# Patient Record
Sex: Male | Born: 2000 | Race: White | Hispanic: Yes | Marital: Single | Smoking: Never smoker
Health system: Southern US, Community
[De-identification: ages and names within clinical notes are randomized; demographics above are authoritative.]

---
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
## 2022-03-18 ENCOUNTER — Emergency Department (HOSPITAL_COMMUNITY): Payer: 59

## 2022-03-18 ENCOUNTER — Other Ambulatory Visit: Payer: Self-pay

## 2022-03-18 ENCOUNTER — Emergency Department (HOSPITAL_COMMUNITY)
Admission: EM | Admit: 2022-03-18 | Discharge: 2022-03-18 | Disposition: A | Discharge status: Home / Self Care | Payer: 59 | Attending: Emergency Medicine | Admitting: Emergency Medicine

## 2022-03-18 DIAGNOSIS — R112 Nausea with vomiting, unspecified: Secondary | ICD-10-CM | POA: Diagnosis not present

## 2022-03-18 DIAGNOSIS — R1084 Generalized abdominal pain: Secondary | ICD-10-CM | POA: Diagnosis not present

## 2022-03-18 DIAGNOSIS — D72829 Elevated white blood cell count, unspecified: Secondary | ICD-10-CM | POA: Insufficient documentation

## 2022-03-18 DIAGNOSIS — R197 Diarrhea, unspecified: Secondary | ICD-10-CM | POA: Diagnosis not present

## 2022-03-18 DIAGNOSIS — R109 Unspecified abdominal pain: Secondary | ICD-10-CM | POA: Diagnosis present

## 2022-03-18 DIAGNOSIS — Z1152 Encounter for screening for COVID-19: Secondary | ICD-10-CM | POA: Diagnosis not present

## 2022-03-18 LAB — COMPREHENSIVE METABOLIC PANEL
ALT: 112 U/L — ABNORMAL HIGH (ref 0–44)
AST: 56 U/L — ABNORMAL HIGH (ref 15–41)
Albumin: 4 g/dL (ref 3.5–5.0)
Alkaline Phosphatase: 67 U/L (ref 38–126)
Anion gap: 12 (ref 5–15)
BUN: 11 mg/dL (ref 6–20)
CO2: 22 mmol/L (ref 22–32)
Calcium: 9 mg/dL (ref 8.9–10.3)
Chloride: 103 mmol/L (ref 98–111)
Creatinine, Ser: 0.81 mg/dL (ref 0.61–1.24)
GFR, Estimated: >60 mL/min (ref >60)
Glucose, Bld: 105 mg/dL — ABNORMAL HIGH (ref 70–99)
Potassium: 3.9 mmol/L (ref 3.5–5.1)
Sodium: 137 mmol/L (ref 135–145)
Total Bilirubin: 0.9 mg/dL (ref 0.3–1.2)
Total Protein: 7.8 g/dL (ref 6.5–8.1)

## 2022-03-18 LAB — URINALYSIS, ROUTINE W REFLEX MICROSCOPIC
Bilirubin Urine: NEGATIVE
Glucose, UA: NEGATIVE mg/dL
Hgb urine dipstick: NEGATIVE
Ketones, ur: NEGATIVE mg/dL
Nitrite: NEGATIVE
Protein, ur: NEGATIVE mg/dL
Specific Gravity, Urine: 1.026 (ref 1.005–1.030)
pH: 5 (ref 5.0–8.0)

## 2022-03-18 LAB — CBC
HCT: 51.6 % (ref 39.0–52.0)
Hemoglobin: 17.2 g/dL — ABNORMAL HIGH (ref 13.0–17.0)
MCH: 29.5 pg (ref 26.0–34.0)
MCHC: 33.3 g/dL (ref 30.0–36.0)
MCV: 88.4 fL (ref 80.0–100.0)
Platelets: 323 10*3/uL (ref 150–400)
RBC: 5.84 MIL/uL — ABNORMAL HIGH (ref 4.22–5.81)
RDW: 12.9 % (ref 11.5–15.5)
WBC: 16 10*3/uL — ABNORMAL HIGH (ref 4.0–10.5)
nRBC: 0 % (ref 0.0–0.2)

## 2022-03-18 LAB — RESP PANEL BY RT-PCR (RSV, FLU A&B, COVID)  RVPGX2
Influenza A by PCR: NEGATIVE
Influenza B by PCR: NEGATIVE
Resp Syncytial Virus by PCR: NEGATIVE
SARS Coronavirus 2 by RT PCR: NEGATIVE

## 2022-03-18 LAB — LIPASE, BLOOD: Lipase: 34 U/L (ref 11–51)

## 2022-03-18 LAB — MAGNESIUM: Magnesium: 1.8 mg/dL (ref 1.7–2.4)

## 2022-03-18 MED ORDER — MORPHINE SULFATE (PF) 4 MG/ML IV SOLN: 4.0000 mg | Freq: Once | INTRAVENOUS | Status: DC

## 2022-03-18 MED ORDER — DICYCLOMINE HCL 10 MG PO CAPS
20.0000 mg | ORAL_CAPSULE | Freq: Once | ORAL | Status: AC
  Administered 2022-03-18: 20 mg via ORAL
  Filled 2022-03-18: qty 2

## 2022-03-18 MED ORDER — ONDANSETRON 4 MG PO TBDP: 4.0000 mg | ORAL_TABLET | Freq: Three times a day (TID) | ORAL | 0 refills | Status: DC | PRN

## 2022-03-18 MED ORDER — PANTOPRAZOLE SODIUM 40 MG IV SOLR
40.0000 mg | Freq: Once | INTRAVENOUS | Status: AC
  Administered 2022-03-18: 40 mg via INTRAVENOUS
  Filled 2022-03-18: qty 10

## 2022-03-18 MED ORDER — ONDANSETRON HCL 4 MG/2ML IJ SOLN
4.0000 mg | Freq: Once | INTRAMUSCULAR | Status: AC
  Administered 2022-03-18: 4 mg via INTRAVENOUS
  Filled 2022-03-18: qty 2

## 2022-03-18 MED ORDER — ONDANSETRON 4 MG PO TBDP
4.0000 mg | ORAL_TABLET | Freq: Once | ORAL | Status: AC | PRN
  Administered 2022-03-18: 4 mg via ORAL
  Filled 2022-03-18: qty 1

## 2022-03-18 MED ORDER — LACTATED RINGERS IV BOLUS
1000.0000 mL | Freq: Once | INTRAVENOUS | Status: AC
  Administered 2022-03-18: 1000 mL via INTRAVENOUS

## 2022-03-18 MED ORDER — ACETAMINOPHEN 500 MG PO TABS: 1000.0000 mg | ORAL_TABLET | Freq: Four times a day (QID) | ORAL | 0 refills | Status: DC | PRN

## 2022-03-18 MED ORDER — FAMOTIDINE 20 MG PO TABS: 20.0000 mg | ORAL_TABLET | Freq: Two times a day (BID) | ORAL | 0 refills | Status: DC

## 2022-03-18 MED ORDER — DICYCLOMINE HCL 20 MG PO TABS: 20.0000 mg | ORAL_TABLET | Freq: Two times a day (BID) | ORAL | 0 refills | Status: DC

## 2022-03-18 MED ORDER — ACETAMINOPHEN 500 MG PO TABS
1000.0000 mg | ORAL_TABLET | ORAL | Status: AC
  Administered 2022-03-18: 1000 mg via ORAL
  Filled 2022-03-18: qty 2

## 2022-03-18 NOTE — ED Triage Notes (Signed)
Patient here with complaint of abdominal pain, diarrhea, and emesis starting this morning. Patient states he thinks his symptoms may be related to something he ate at [employer] last night. Patient states emesis is now provoked by eating or drinking anything.

## 2022-03-18 NOTE — Discharge Instructions (Addendum)
History, take Tylenol 1000 mg every 6 hours, Zofran every 8 hours for 24 hours after that as needed for nausea.  Bentyl as prescribed as needed for pain.  Hydrate rest and follow-up with your primary care doctor return to the emergency room for any new or concerning symptoms.

## 2022-03-18 NOTE — ED Provider Notes (Signed)
[facility] Provider Note   CSN: [id number] Arrival date & time: 03/18/22  1702     History  Chief Complaint  Patient presents with   Abdominal Pain    William Rivas is a 21 y.o. male.   Abdominal Pain  Patient is a 21 year old male with no medical problems and no past surgical history  He is present emergency room today with complaints of nausea vomiting diarrhea that began yesterday evening.  He states this began after eating some food at a restaurant called [employer].  He states that his girlfriend who ate with him also had several episodes of vomiting but has since felt much better.  He states that he had 5 episodes of nonbilious emesis today.  He did state that he had a small thin streak of red and some of his emesis but states that he did also drink a red Gatorade.  He denies any history of alcohol use or liver disease.  He denies any melena or hematochezia.  No chest pain difficulty breathing.  He states his abdominal pain is crampy aching diffuse.  No urinary symptoms.      Home Medications Prior to Admission medications   Medication Sig Start Date End Date Taking? Authorizing Provider  acetaminophen (TYLENOL) 500 MG tablet Take 2 tablets (1,000 mg total) by mouth every 6 (six) hours as needed. 03/18/22  Yes Broly Hatfield S, PA  dicyclomine (BENTYL) 20 MG tablet Take 1 tablet (20 mg total) by mouth 2 (two) times daily. 03/18/22  Yes Dante Roudebush S, PA  famotidine (PEPCID) 20 MG tablet Take 1 tablet (20 mg total) by mouth 2 (two) times daily. 03/18/22  Yes Shaely Gadberry S, PA  ondansetron (ZOFRAN-ODT) 4 MG disintegrating tablet Take 1 tablet (4 mg total) by mouth every 8 (eight) hours as needed for nausea or vomiting. 03/18/22  Yes Kennett Symes S, PA  ibuprofen (ADVIL) 600 MG tablet Take 1 tablet (600 mg total) by mouth every 6 (six) hours as needed. 05/23/21   Dartha LodgeFord, Kelsey N, PA-C  methocarbamol (ROBAXIN) 500 MG  tablet Take 1 tablet (500 mg total) by mouth 2 (two) times daily. 05/23/21   Dartha LodgeFord, Kelsey N, PA-C      Allergies    Patient has no known allergies.    Review of Systems   Review of Systems  Gastrointestinal:  Positive for abdominal pain.    Physical Exam Updated Vital Signs BP 115/71   Pulse 90   Temp 99.8 F (37.7 C) (Oral)   Resp 20   SpO2 93%  Physical Exam Vitals and nursing note reviewed.  Constitutional:      General: He is not in acute distress. HENT:     Head: Normocephalic and atraumatic.     Nose: Nose normal.     Mouth/Throat:     Mouth: Mucous membranes are dry.  Eyes:     General: No scleral icterus. Cardiovascular:     Rate and Rhythm: Normal rate and regular rhythm.     Pulses: Normal pulses.     Heart sounds: Normal heart sounds.  Pulmonary:     Effort: Pulmonary effort is normal. No respiratory distress.     Breath sounds: No wheezing.  Abdominal:     Palpations: Abdomen is soft.     Tenderness: There is abdominal tenderness. There is no guarding or rebound.     Comments: Very mild nonfocal generalized abdominal tenderness.  Negative Rovsing and McBurney.  No guarding or  rebound  Musculoskeletal:     Cervical back: Normal range of motion.     Right lower leg: No edema.     Left lower leg: No edema.  Skin:    General: Skin is warm and dry.     Capillary Refill: Capillary refill takes less than 2 seconds.  Neurological:     Mental Status: He is alert. Mental status is at baseline.  Psychiatric:        Mood and Affect: Mood normal.        Behavior: Behavior normal.     ED Results / Procedures / Treatments   Labs (all labs ordered are listed, but only abnormal results are displayed) Labs Reviewed  COMPREHENSIVE METABOLIC PANEL - Abnormal; Notable for the following components:      Result Value   Glucose, Bld 105 (*)    AST 56 (*)    ALT 112 (*)    All other components within normal limits  CBC - Abnormal; Notable for the following  components:   WBC 16.0 (*)    RBC 5.84 (*)    Hemoglobin 17.2 (*)    All other components within normal limits  URINALYSIS, ROUTINE W REFLEX MICROSCOPIC - Abnormal; Notable for the following components:   Leukocytes,Ua TRACE (*)    Bacteria, UA RARE (*)    All other components within normal limits  RESP PANEL BY RT-PCR (RSV, FLU A&B, COVID)  RVPGX2  GASTROINTESTINAL PANEL BY PCR, STOOL (REPLACES STOOL CULTURE)  LIPASE, BLOOD  MAGNESIUM    EKG None  Radiology DG Chest 2 View  Result Date: 03/18/2022 CLINICAL DATA:  Hematemesis. EXAM: CHEST - 2 VIEW COMPARISON:  Chest two views 01/02/2004 and 06/16/2003 FINDINGS: Cardiac silhouette and mediastinal contours are within limits. Mildly to moderately decreased lung volumes. The lungs are clear. No pleural effusion or pneumothorax. No acute skeletal abnormality. IMPRESSION: Mildly to moderately decreased lung volumes. No acute cardiopulmonary process. Electronically Signed   By: Neita Garnet M.D.   On: 03/18/2022 18:19    Procedures Procedures    Medications Ordered in ED Medications  ondansetron (ZOFRAN-ODT) disintegrating tablet 4 mg (4 mg Oral Given 03/18/22 1732)  acetaminophen (TYLENOL) tablet 1,000 mg (1,000 mg Oral Given 03/18/22 1843)  dicyclomine (BENTYL) capsule 20 mg (20 mg Oral Given 03/18/22 1843)  lactated ringers bolus 1,000 mL (0 mLs Intravenous Stopped 03/18/22 2037)  ondansetron (ZOFRAN) injection 4 mg (4 mg Intravenous Given 03/18/22 1845)  pantoprazole (PROTONIX) injection 40 mg (40 mg Intravenous Given 03/18/22 1844)  lactated ringers bolus 1,000 mL (0 mLs Intravenous Stopped 03/18/22 2038)    ED Course/ Medical Decision Making/ A&P Clinical Course as of 03/18/22 2130  Sat Mar 18, 2022  1827 NVD started last night -- 5 ep vom today. Some red emesis -- red gatorade last night.    [WF]    Clinical Course User Index [WF] Gailen Shelter, PA                           Medical Decision Making Amount and/or  Complexity of Data Reviewed Labs: ordered.  Risk OTC drugs. Prescription drug management.   This patient presents to the ED for concern of nausea vomiting diarrhea abdominal pain fatigue, this involves a number of treatment options, and is a complaint that carries with it a moderate to high risk of complications and morbidity. A differential diagnosis was considered for the patient's symptoms which is discussed below:  The emergent differential diagnosis for vomiting includes, but is not limited to ACS/MI, Boerhaave's, DKA, Intracranial Hemorrhage, Ischemic bowel, Meningitis, Sepsis, Acute gastric dilation, Acetaminophen toxicity, Adrenal insufficiency, Appendicitis, Aspirin toxicity, Bowel obstruction/ileus, Cholecystitis, CNS tumor. Electrolyte abnormalities, Elevated ICP, Gastric outlet obstruction, Hyperemesis gravidarum, Pancreatitis, Peritonitis, Ruptured viscus, Testicular torsion/ovarian torsion, Biliary colic, Cannabinoid hyperemesis syndrome, Disulfiram effect, ETOH, Gastritis, Gastroenteritis, Gastroparesis, Hepatitis, Ibuprofen, Labyrinthitis, Migraine, Motion sickness, Narcotic withdrawal, Thyroid, Pregnancy, Peptic ulcer disease, Renal colic, and UTI  The causes of generalized abdominal pain include but are not limited to AAA, mesenteric ischemia, appendicitis, diverticulitis, DKA, gastritis, gastroenteritis, AMI, nephrolithiasis, pancreatitis, peritonitis, adrenal insufficiency,lead poisoning, iron toxicity, intestinal ischemia, constipation, UTI,SBO/LBO, splenic rupture, biliary disease, IBD, IBS, PUD, or hepatitis.   Co morbidities: Discussed in HPI   Brief History:  Patient is a 21 year old male with no medical problems and no past surgical history  He is present emergency room today with complaints of nausea vomiting diarrhea that began yesterday evening.  He states this began after eating some food at a restaurant called [employer].  He states that his girlfriend who  ate with him also had several episodes of vomiting but has since felt much better.  He states that he had 5 episodes of nonbilious emesis today.  He did state that he had a small thin streak of red and some of his emesis but states that he did also drink a red Gatorade.  He denies any history of alcohol use or liver disease.  He denies any melena or hematochezia.  No chest pain difficulty breathing.  He states his abdominal pain is crampy aching diffuse.  No urinary symptoms.     EMR reviewed including pt PMHx, past surgical history and past visits to ER.   See HPI for more details   Lab Tests:   I ordered and independently interpreted labs. Labs notable for Leukocytosis of 16 with elevated hemoglobin consistent with dehydration and hemoconcentration.  CMP with mild transaminitis likely caused by viral gastroenteritis.  Magnesium within normal limits COVID influenza RSV negative.  Urinalysis unremarkable lipase within normal limits. Sent patient home with GI pathogen panel collection materials.   Imaging Studies:  Offered CT on pelvis with contrast    Cardiac Monitoring:  The patient was maintained on a cardiac monitor.  I personally viewed and interpreted the cardiac monitored which showed an underlying rhythm of: NSR NA   Medicines ordered:  I ordered medication including lactated Ringer's 2 L, Zofran, Protonix, Tylenol, Bentyl for nausea vomiting diarrhea abdominal pain Reevaluation of the patient after these medicines showed that the patient resolved I have reviewed the patients home medicines and have made adjustments as needed   Critical Interventions:     Consults/Attending Physician   I discussed this case with my attending physician who cosigned this note including patient's presenting symptoms, physical exam, and planned diagnostics and interventions. Attending physician stated agreement with plan or made changes to plan which were  implemented.   Reevaluation:  After the interventions noted above I re-evaluated patient and found that they have :resolved   Social Determinants of Health:      Problem List / ED Course:  Nausea vomiting and diarrhea abdominal pain.  Patient's partner who ate with him last night had similar symptoms but they improved.  Patient has leukocytosis which is likely reactive in the setting of vomiting forcefully multiple times.  I doubt acute appendicitis.  Patient symptoms were somewhat abrupt in onset since yesterday associated nausea vomiting and diarrhea and  on reassessment after hydration and only Tylenol for analgesia patient has no abdominal tenderness no guarding or rebound.  He is able to stand at bedside jump and land on heels without pain.  She decision-making ovulation with patient he preferred to hold off on any cross-sectional imaging at this time, hydrate, Tylenol and antiemetics.  Follow-up with PCP.  Return precautions to the ER were provided.  Patient is tolerating p.o.  Ambulatory.  Will discharge home at this time. Dehydration likely secondary to above.   Dispostion:  After consideration of the diagnostic results and the patients response to treatment, I feel that the patent would benefit from close outpatient follow-up.  Return precautions   Final Clinical Impression(s) / ED Diagnoses Final diagnoses:  Generalized abdominal pain  Nausea vomiting and diarrhea    Rx / DC Orders ED Discharge Orders          Ordered    ondansetron (ZOFRAN-ODT) 4 MG disintegrating tablet  Every 8 hours PRN        03/18/22 2058    acetaminophen (TYLENOL) 500 MG tablet  Every 6 hours PRN        03/18/22 2058    dicyclomine (BENTYL) 20 MG tablet  2 times daily        03/18/22 2058    famotidine (PEPCID) 20 MG tablet  2 times daily        03/18/22 2058              Gailen Shelter, [place] 03/18/22 2133    Virgina Norfolk, DO 03/18/22 2145

## 2022-03-18 NOTE — ED Provider Triage Note (Signed)
Emergency Medicine Provider Triage Evaluation Note  William Rivas , a 21 y.o. male  was evaluated in triage.  Pt complains of nausea, vomiting, diarrhea with generalized abdominal pain.  Patient reports that this all started today.  Last night he is a bit expressed with the things is the culprit of his symptoms.  He reports that he is been vomiting several times over the last 3 times and vomiting some blood.  No melena or hematochezia.  No fevers.  No chest pain or sore throat..  Review of Systems  Positive:  Negative:   Physical Exam  BP 122/89 (BP Location: Right Arm)   Pulse (!) 135   Temp (!) 101 F (38.3 C)   Resp 16   SpO2 94%  Gen:   Awake, no distress   Resp:  Normal effort  MSK:   Moves extremities without difficulty  Other:  Generalized abdominal tenderness however very mild.  No guarding or rebound noted.  Medical Decision Making  Medically screening exam initiated at 5:36 PM.  Appropriate orders placed.  William Rivas was informed that the remainder of the evaluation will be completed by another provider, this initial triage assessment does not replace that evaluation, and the importance of remaining in the ED until their evaluation is complete.  Labs ordered   William Rivas, [place] 03/18/22 1737
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
## 2023-05-11 ENCOUNTER — Ambulatory Visit (HOSPITAL_COMMUNITY)
Admission: EM | Admit: 2023-05-11 | Discharge: 2023-05-11 | Disposition: A | Discharge status: Home / Self Care | Payer: BC Managed Care – PPO | Attending: Emergency Medicine | Admitting: Emergency Medicine

## 2023-05-11 ENCOUNTER — Encounter (HOSPITAL_COMMUNITY): Payer: Self-pay

## 2023-05-11 ENCOUNTER — Ambulatory Visit (INDEPENDENT_AMBULATORY_CARE_PROVIDER_SITE_OTHER): Payer: BC Managed Care – PPO

## 2023-05-11 DIAGNOSIS — R053 Chronic cough: Secondary | ICD-10-CM

## 2023-05-11 DIAGNOSIS — R058 Other specified cough: Secondary | ICD-10-CM

## 2023-05-11 MED ORDER — BENZONATATE 100 MG PO CAPS: 100.0000 mg | ORAL_CAPSULE | Freq: Three times a day (TID) | ORAL | 0 refills | Status: AC | PRN

## 2023-05-11 MED ORDER — ALBUTEROL SULFATE HFA 108 (90 BASE) MCG/ACT IN AERS
INHALATION_SPRAY | RESPIRATORY_TRACT | Status: AC
  Filled 2023-05-11: qty 6.7

## 2023-05-11 MED ORDER — ALBUTEROL SULFATE HFA 108 (90 BASE) MCG/ACT IN AERS
2.0000 | INHALATION_SPRAY | Freq: Once | RESPIRATORY_TRACT | Status: AC
  Administered 2023-05-11: 2 via RESPIRATORY_TRACT

## 2023-05-11 MED ORDER — PREDNISONE 20 MG PO TABS: 40.0000 mg | ORAL_TABLET | Freq: Every day | ORAL | 0 refills | Status: AC

## 2023-05-11 MED ORDER — ALBUTEROL SULFATE HFA 108 (90 BASE) MCG/ACT IN AERS: 2.0000 | INHALATION_SPRAY | Freq: Four times a day (QID) | RESPIRATORY_TRACT | 1 refills | Status: AC | PRN

## 2023-05-11 NOTE — Discharge Instructions (Addendum)
 I do not see anything abnormal on your chest x-ray.  I will call you if the radiologist sees something different.  For now I would like to treat you for post-viral cough:  Try albuterol inhaler -- 2 puffs inhaled 2-3 times daily for the next several days   Prednisone -- 40 mg daily for the next 5 days  Tessalon -- these are cough pills that can be taken 3 times daily. If this medication makes you drowsy, take only one pill before bed.  Please contact your primary care provider to make an appointment for follow up

## 2023-05-11 NOTE — ED Provider Notes (Signed)
 [facility]    CSN: [id number] Arrival date & time: 05/11/23  0906      History   Chief Complaint Chief Complaint  Patient presents with   Cough    HPI William Rivas is a 23 y.o. male.  Here with 2-3 week history of cough Cough is productive of green mucous He thinks he had the flu at beginning. Tactile fever and body aches. Has not had recently. No temp has been measured.  Denies chest pain, shortness of breath, wheezing No nasal congestion or sinus pressure.  Has been using nyquil   History reviewed. No pertinent past medical history.  There are no active problems to display for this patient.   History reviewed. No pertinent surgical history.    Home Medications    Prior to Admission medications   Medication Sig Start Date End Date Taking? Authorizing Provider  albuterol (VENTOLIN HFA) 108 (90 Base) MCG/ACT inhaler Inhale 2 puffs into the lungs every 6 (six) hours as needed for wheezing or shortness of breath. 05/11/23  Yes Teoman Giraud, Lurena Joiner, PA-C  benzonatate (TESSALON) 100 MG capsule Take 1 capsule (100 mg total) by mouth 3 (three) times daily as needed for cough. 05/11/23  Yes Oni Dietzman, Lurena Joiner, PA-C  predniSONE (DELTASONE) 20 MG tablet Take 2 tablets (40 mg total) by mouth daily with breakfast for 5 days. 05/11/23 05/16/23 Yes Tyisha Cressy, Ray Church    Family History History reviewed. No pertinent family history.  Social History Social History   Tobacco Use   Smoking status: Never  Substance Use Topics   Alcohol use: No   Drug use: No     Allergies   Patient has no known allergies.   Review of Systems Review of Systems  Respiratory:  Positive for cough.    Per HPI  Physical Exam Triage Vital Signs ED Triage Vitals  Encounter Vitals Group     BP 05/11/23 1044 (!) 132/94     Systolic BP Percentile --      Diastolic BP Percentile --      Pulse Rate 05/11/23 1044 92     Resp 05/11/23 1044 18     Temp 05/11/23 1044 97.9 F (36.6  C)     Temp Source 05/11/23 1044 Oral     SpO2 05/11/23 1044 95 %     Weight --      Height --      Head Circumference --      Peak Flow --      Pain Score 05/11/23 1045 0     Pain Loc --      Pain Education --      Exclude from Growth Chart --    No data found.  Updated Vital Signs BP (!) 132/94 (BP Location: Left Arm)   Pulse 92   Temp 97.9 F (36.6 C) (Oral)   Resp 18   SpO2 95%   Physical Exam Vitals and nursing note reviewed.  Constitutional:      General: He is not in acute distress.    Appearance: He is not ill-appearing.  HENT:     Right Ear: Tympanic membrane and ear canal normal.     Left Ear: Tympanic membrane and ear canal normal.     Nose: No congestion or rhinorrhea.     Mouth/Throat:     Mouth: Mucous membranes are moist.     Pharynx: Oropharynx is clear. No posterior oropharyngeal erythema.  Eyes:     Conjunctiva/sclera: Conjunctivae normal.  Cardiovascular:  Rate and Rhythm: Normal rate and regular rhythm.     Pulses: Normal pulses.     Heart sounds: Normal heart sounds.  Pulmonary:     Effort: Pulmonary effort is normal.     Breath sounds: Normal breath sounds. No wheezing, rhonchi or rales.  Musculoskeletal:     Cervical back: Normal range of motion.  Lymphadenopathy:     Cervical: No cervical adenopathy.  Skin:    General: Skin is warm and dry.  Neurological:     Mental Status: He is alert and oriented to person, place, and time.     UC Treatments / Results  Labs (all labs ordered are listed, but only abnormal results are displayed) Labs Reviewed - No data to display  EKG  Radiology No results found.  Procedures Procedures   Medications Ordered in UC Medications  albuterol (VENTOLIN HFA) 108 (90 Base) MCG/ACT inhaler 2 puff (has no administration in time range)    Initial Impression / Assessment and Plan / UC Course  I have reviewed the triage vital signs and the nursing notes.  Pertinent labs & imaging results that  were available during my care of the patient were reviewed by me and considered in my medical decision making (see chart for details).  Afebrile currently. Lungs clear to auscultation  Chest xray without acute abnormality on preliminary read.  Could.  With reading from 02/2022. Awaiting radiology final read. Treat for post-viral cough syndrome Try albuterol inhaler. 2 puffs given in clinic to demonstrate use. Prednisone 40 mg daily x 5 days Tessalon TID prn Advised establish with PCP for follow up. Return and ED precautions for any worsening. Patient agrees to plan, all questions answered   Final Clinical Impressions(s) / UC Diagnoses   Final diagnoses:  Persistent cough for 3 weeks or longer  Post-viral cough syndrome     Discharge Instructions      I do not see anything abnormal on your chest x-ray.  I will call you if the radiologist sees something different.  For now I would like to treat you for post-viral cough:  Try albuterol inhaler -- 2 puffs inhaled 2-3 times daily for the next several days   Prednisone -- 40 mg daily for the next 5 days  Tessalon -- these are cough pills that can be taken 3 times daily. If this medication makes you drowsy, take only one pill before bed.  Please contact your primary care provider to make an appointment for follow up     ED Prescriptions     Medication Sig Dispense Auth. Provider   predniSONE (DELTASONE) 20 MG tablet Take 2 tablets (40 mg total) by mouth daily with breakfast for 5 days. 10 tablet Dennison Mcdaid, PA-C   benzonatate (TESSALON) 100 MG capsule Take 1 capsule (100 mg total) by mouth 3 (three) times daily as needed for cough. 30 capsule Leonce Bale, PA-C   albuterol (VENTOLIN HFA) 108 (90 Base) MCG/ACT inhaler Inhale 2 puffs into the lungs every 6 (six) hours as needed for wheezing or shortness of breath. 8 g Adom Schoeneck, Lurena Joiner, PA-C      PDMP not reviewed this encounter.   Monifa Blanchette, Lurena Joiner, PA-C 05/11/23 1200

## 2023-05-11 NOTE — ED Triage Notes (Signed)
 Pt reports cough x 2 weeks. Patient has been taking Nyquil with no relief. Patient denies any chills or fever today.
